# Patient Record
Sex: Male | Born: 1965 | Race: White | Hispanic: No | Marital: Single
Health system: Southern US, Community
[De-identification: ages and names within clinical notes are randomized; demographics above are authoritative.]

---

## 1998-02-02 ENCOUNTER — Emergency Department (HOSPITAL_COMMUNITY): Admission: EM | Admit: 1998-02-02 | Discharge: 1998-02-02 | Payer: Self-pay | Admitting: Emergency Medicine

## 1998-11-13 ENCOUNTER — Emergency Department (HOSPITAL_COMMUNITY): Admission: EM | Admit: 1998-11-13 | Discharge: 1998-11-13 | Payer: Self-pay | Admitting: Emergency Medicine

## 1998-11-13 ENCOUNTER — Encounter: Payer: Self-pay | Admitting: Emergency Medicine

## 2003-09-29 ENCOUNTER — Emergency Department (HOSPITAL_COMMUNITY): Admission: EM | Admit: 2003-09-29 | Discharge: 2003-09-29 | Payer: Self-pay

## 2009-02-27 ENCOUNTER — Emergency Department (HOSPITAL_COMMUNITY): Admission: EM | Admit: 2009-02-27 | Discharge: 2009-02-27 | Payer: Self-pay | Admitting: Family Medicine

## 2010-04-14 ENCOUNTER — Emergency Department (HOSPITAL_COMMUNITY)
Admission: EM | Admit: 2010-04-14 | Discharge: 2010-04-14 | Payer: Self-pay | Source: Home / Self Care | Admitting: Emergency Medicine

## 2011-01-14 ENCOUNTER — Emergency Department: Payer: Self-pay

## 2011-08-26 ENCOUNTER — Emergency Department: Payer: Self-pay | Admitting: Emergency Medicine

## 2011-08-26 LAB — CBC
HCT: 47.4 % (ref 40.0–52.0)
HGB: 16.3 g/dL (ref 13.0–18.0)
MCH: 32.1 pg (ref 26.0–34.0)
MCHC: 34.3 g/dL (ref 32.0–36.0)
MCV: 93 fL (ref 80–100)
Platelet: 257 10*3/uL (ref 150–440)
RBC: 5.07 10*6/uL (ref 4.40–5.90)
RDW: 12.3 % (ref 11.5–14.5)
WBC: 9.6 10*3/uL (ref 3.8–10.6)

## 2011-08-26 LAB — COMPREHENSIVE METABOLIC PANEL
Albumin: 4.4 g/dL (ref 3.4–5.0)
Alkaline Phosphatase: 62 U/L (ref 50–136)
Anion Gap: 7 (ref 7–16)
BUN: 6 mg/dL — ABNORMAL LOW (ref 7–18)
Bilirubin,Total: 0.7 mg/dL (ref 0.2–1.0)
Calcium, Total: 9.5 mg/dL (ref 8.5–10.1)
Chloride: 103 mmol/L (ref 98–107)
Co2: 26 mmol/L (ref 21–32)
Creatinine: 0.87 mg/dL (ref 0.60–1.30)
EGFR (African American): >60
EGFR (Non-African Amer.): >60
Glucose: 149 mg/dL — ABNORMAL HIGH (ref 65–99)
Osmolality: 272 (ref 275–301)
Potassium: 3.7 mmol/L (ref 3.5–5.1)
SGOT(AST): 18 U/L (ref 15–37)
SGPT (ALT): 32 U/L
Sodium: 136 mmol/L (ref 136–145)
Total Protein: 8.3 g/dL — ABNORMAL HIGH (ref 6.4–8.2)

## 2011-08-26 LAB — URINALYSIS, COMPLETE
Bacteria: NONE SEEN
Bilirubin,UR: NEGATIVE
Glucose,UR: NEGATIVE mg/dL (ref 0–75)
Ketone: NEGATIVE
Leukocyte Esterase: NEGATIVE
Nitrite: NEGATIVE
Ph: 7 (ref 4.5–8.0)
Protein: NEGATIVE
RBC,UR: 343 /HPF (ref 0–5)
Specific Gravity: 1.006 (ref 1.003–1.030)
Squamous Epithelial: NONE SEEN
WBC UR: 3 /HPF (ref 0–5)

## 2011-08-26 LAB — LIPASE, BLOOD: Lipase: 145 U/L (ref 73–393)

## 2011-09-11 ENCOUNTER — Emergency Department: Payer: Self-pay | Admitting: Emergency Medicine

## 2011-09-11 LAB — URINALYSIS, COMPLETE
Bacteria: NONE SEEN
Glucose,UR: NEGATIVE mg/dL (ref 0–75)
Ketone: NEGATIVE
Leukocyte Esterase: NEGATIVE
Ph: 8 (ref 4.5–8.0)
RBC,UR: 467 /HPF (ref 0–5)
Squamous Epithelial: 1
WBC UR: <1 /HPF (ref 0–5)

## 2011-09-11 LAB — BASIC METABOLIC PANEL
Anion Gap: 6 — ABNORMAL LOW (ref 7–16)
BUN: 9 mg/dL (ref 7–18)
Calcium, Total: 9.5 mg/dL (ref 8.5–10.1)
Chloride: 101 mmol/L (ref 98–107)
Co2: 29 mmol/L (ref 21–32)
Creatinine: 0.88 mg/dL (ref 0.60–1.30)
EGFR (African American): >60
EGFR (Non-African Amer.): >60
Glucose: 119 mg/dL — ABNORMAL HIGH (ref 65–99)
Osmolality: 272 (ref 275–301)
Potassium: 4.2 mmol/L (ref 3.5–5.1)
Sodium: 136 mmol/L (ref 136–145)

## 2011-09-11 LAB — CBC
HCT: 48.2 % (ref 40.0–52.0)
HGB: 15.8 g/dL (ref 13.0–18.0)
MCH: 31 pg (ref 26.0–34.0)
MCHC: 32.8 g/dL (ref 32.0–36.0)
MCV: 94 fL (ref 80–100)
Platelet: 273 10*3/uL (ref 150–440)
RBC: 5.11 10*6/uL (ref 4.40–5.90)
RDW: 12.7 % (ref 11.5–14.5)
WBC: 10.3 10*3/uL (ref 3.8–10.6)

## 2020-11-28 ENCOUNTER — Emergency Department: Payer: Self-pay

## 2020-11-28 ENCOUNTER — Other Ambulatory Visit: Payer: Self-pay

## 2020-11-28 ENCOUNTER — Emergency Department
Admission: EM | Admit: 2020-11-28 | Discharge: 2020-11-28 | Disposition: A | Discharge status: Home / Self Care | Payer: Self-pay | Attending: Emergency Medicine | Admitting: Emergency Medicine

## 2020-11-28 DIAGNOSIS — M5431 Sciatica, right side: Secondary | ICD-10-CM

## 2020-11-28 DIAGNOSIS — M5441 Lumbago with sciatica, right side: Secondary | ICD-10-CM | POA: Insufficient documentation

## 2020-11-28 DIAGNOSIS — M5442 Lumbago with sciatica, left side: Secondary | ICD-10-CM | POA: Insufficient documentation

## 2020-11-28 DIAGNOSIS — M5432 Sciatica, left side: Secondary | ICD-10-CM

## 2020-11-28 LAB — URINALYSIS, ROUTINE W REFLEX MICROSCOPIC
Bacteria, UA: NONE SEEN
Bilirubin Urine: NEGATIVE
Glucose, UA: NEGATIVE mg/dL
Ketones, ur: NEGATIVE mg/dL
Leukocytes,Ua: NEGATIVE
Nitrite: NEGATIVE
Protein, ur: NEGATIVE mg/dL
Specific Gravity, Urine: 1.009 (ref 1.005–1.030)
Squamous Epithelial / LPF: NONE SEEN (ref 0–5)
pH: 6 (ref 5.0–8.0)

## 2020-11-28 MED ORDER — LIDOCAINE 5 % EX PTCH
1.0000 | MEDICATED_PATCH | Freq: Once | CUTANEOUS | Status: DC
  Administered 2020-11-28: 1 via TRANSDERMAL
  Filled 2020-11-28: qty 1

## 2020-11-28 MED ORDER — METHOCARBAMOL 500 MG PO TABS: 500.0000 mg | ORAL_TABLET | Freq: Four times a day (QID) | ORAL | 0 refills | Status: AC | PRN

## 2020-11-28 MED ORDER — METHOCARBAMOL 500 MG PO TABS
500.0000 mg | ORAL_TABLET | Freq: Once | ORAL | Status: AC
  Administered 2020-11-28: 500 mg via ORAL
  Filled 2020-11-28: qty 1

## 2020-11-28 MED ORDER — LIDOCAINE 5 % EX PTCH: 1.0000 | MEDICATED_PATCH | Freq: Two times a day (BID) | CUTANEOUS | 1 refills | Status: AC

## 2020-11-28 MED ORDER — NAPROXEN 500 MG PO TABS
500.0000 mg | ORAL_TABLET | Freq: Once | ORAL | Status: AC
  Administered 2020-11-28: 500 mg via ORAL
  Filled 2020-11-28: qty 1

## 2020-11-28 MED ORDER — ACETAMINOPHEN 500 MG PO TABS
1000.0000 mg | ORAL_TABLET | Freq: Once | ORAL | Status: AC
  Administered 2020-11-28: 1000 mg via ORAL
  Filled 2020-11-28: qty 2

## 2020-11-28 NOTE — ED Provider Notes (Signed)
[facility] Emergency Department Provider Note ____________________________________________   Event Date/Time   First MD Initiated Contact with Patient 11/28/20 1152     (approximate)  I have reviewed the triage vital signs and the nursing notes.  HISTORY  Chief Complaint Back Pain   HPI Jason Simon is a 55 y.o. malewho presents to the ED for evaluation of left back pain.   Chart review indicates no relevant hx.   Patient presents to the ED for evaluation of atraumatic bilateral back pain of the past 2-3 days.  He reports shooting pains, down his right-sided buttocks, that are sharp and "feel like lightning."  He reports waking up with the symptoms and denies any trauma or falls.  Denies fevers, saddle anesthesias, stool or urinary retention.   Patient presents with his mother.  As documented below, she does pull one of our technicians aside and confides in her that the patient did fall a couple days ago, but for some reason just really does not want to tell us.  No past medical history on file.  There are no problems to display for this patient.     Prior to Admission medications   Medication Sig Start Date End Date Taking? Authorizing Provider  lidocaine (LIDODERM) 5 % Place 1 patch onto the skin every 12 (twelve) hours. Remove & Discard patch within 12 hours or as directed by MD 11/28/20 11/28/21 Yes Delton Prairie, MD  methocarbamol (ROBAXIN) 500 MG tablet Take 1 tablet (500 mg total) by mouth every 6 (six) hours as needed for muscle spasms. 11/28/20  Yes Delton Prairie, MD    Allergies Patient has no known allergies.  No family history on file.  Social History    Review of Systems  Constitutional: No fever/chills Eyes: No visual changes. ENT: No sore throat. Cardiovascular: Denies chest pain. Respiratory: Denies shortness of breath. Gastrointestinal: No abdominal pain.  No nausea, no vomiting.  No diarrhea.  No  constipation. Genitourinary: Negative for dysuria. Musculoskeletal: Positive for back pain with sciatica Skin: Negative for rash. Neurological: Negative for headaches, focal weakness or numbness.   ____________________________________________   PHYSICAL EXAM:  VITAL SIGNS: Vitals:   11/28/20 1125 11/28/20 1126  BP:  (!) 167/90  Pulse: 100   Resp: 18   Temp: 98.7 F (37.1 C)   SpO2: 96%      Constitutional: Alert and oriented. Well appearing and in no acute distress.  Able to swing his legs over and stand up from the stretcher.  States hunched over, and ambulates with a shuffling gait Eyes: Conjunctivae are normal. PERRL. EOMI. Head: Atraumatic. Nose: No congestion/rhinnorhea. Mouth/Throat: Mucous membranes are moist.  Oropharynx non-erythematous. Neck: No stridor. No cervical spine tenderness to palpation. Cardiovascular: Normal rate, regular rhythm. Grossly normal heart sounds.  Good peripheral circulation. Respiratory: Normal respiratory effort.  No retractions. Lungs CTAB. Gastrointestinal: Soft , nondistended, nontender to palpation. No CVA tenderness. Musculoskeletal: No lower extremity tenderness nor edema.  No joint effusions. No signs of acute trauma. No spinal tenderness, signs of trauma to the back or spinal step-offs.  Tenderness to palpation to left-sided paraspinal lumbar and sacral musculature. On reassessment, I again assess his lower thoracic and upper L-spine, without point bony tenderness to these areas. Neurologic:  Normal speech and language. No gross focal neurologic deficits are appreciated. No gait instability noted. Skin:  Skin is warm, dry and intact. No rash noted. Psychiatric: Mood and affect are normal. Speech and behavior are normal.  ____________________________________________  LABS (all labs ordered are listed, but only abnormal results are displayed)  Labs Reviewed  URINALYSIS, ROUTINE W REFLEX MICROSCOPIC - Abnormal; Notable for the  following components:      Result Value   Color, Urine YELLOW (*)    APPearance CLEAR (*)    Hgb urine dipstick MODERATE (*)    All other components within normal limits   ____________________________________________  12 Lead EKG   ____________________________________________  RADIOLOGY  ED MD interpretation:    Official radiology report(s): DG Lumbar Spine Complete  Result Date: 11/28/2020 CLINICAL DATA:  Fall, right leg pain EXAM: LUMBAR SPINE - COMPLETE 4+ VIEW COMPARISON:  None. FINDINGS: Degenerative facet disease in the lower lumbar spine. Normal alignment. Mild wedge deformity of the T12 vertebral body. Cannot exclude mild compression fracture. Transitional anatomy at the lumbosacral junction. IMPRESSION: Mild wedged appearance of the T12 vertebral body, question mild compression fracture. Transitional anatomy at the lumbosacral junction. Degenerative facet disease in the lower lumbar spine. Electronically Signed   By: Charlett Nose M.D.   On: 11/28/2020 12:40    ____________________________________________   PROCEDURES and INTERVENTIONS  Procedure(s) performed (including Critical Care):  Procedures  Medications  lidocaine (LIDODERM) 5 % 1 patch (1 patch Transdermal Patch Applied 11/28/20 1208)  acetaminophen (TYLENOL) tablet 1,000 mg (1,000 mg Oral Given 11/28/20 1207)  naproxen (NAPROSYN) tablet 500 mg (500 mg Oral Given 11/28/20 1208)  methocarbamol (ROBAXIN) tablet 500 mg (500 mg Oral Given 11/28/20 1208)    ____________________________________________   MDM / ED COURSE   55 year old male presents to the ED with bilateral sciatica symptoms, without red flag features, and amenable to outpatient management.  No evidence of cauda equina syndrome.  He denies falls or trauma, his mother pulses aside and indicates otherwise.  Plain films were therefore obtained and questions a T12 mild compression wedge fracture.  He has no tenderness to this area and his pain is much  lower than this, so I do not suspect an acute compression fracture.  He is ambulatory, with improving symptoms with nonnarcotic multimodal analgesia.  Will discharge with the same and with return precautions for the ED.   Clinical Course as of 11/28/20 1330  Tue Nov 28, 2020  1209 One of our EMT techs approaches me, tells me that patient's mother approached her and told her that the patient did fall a couple days ago prior to his pain starting.  She reports that he does not want to tell any of [place], for unknown reasons. But she does know that he fell. Thinks he needs an XR [DS]  1314 Reassessed. Pt reports feeling. Reassessed his back [DS]    Clinical Course User Index [DS] Delton Prairie, MD    ____________________________________________   FINAL CLINICAL IMPRESSION(S) / ED DIAGNOSES  Final diagnoses:  Bilateral sciatica     ED Discharge Orders          Ordered    lidocaine (LIDODERM) 5 %  Every 12 hours        11/28/20 1159    methocarbamol (ROBAXIN) 500 MG tablet  Every 6 hours PRN        11/28/20 1159             Azarria Balint   Note:  This document was prepared using Conservation officer, historic buildings and may include unintentional dictation errors.    Delton Prairie, MD 11/28/20 1332

## 2020-11-28 NOTE — Discharge Instructions (Addendum)
Use Tylenol for pain and fevers.  Up to 1000 mg per dose, up to 4 times per day.  Do not take more than 4000 mg of Tylenol/acetaminophen within 24 hours..  Use naproxen/Aleve for anti-inflammatory pain relief. Use up to 500mg  every 12 hours. Do not take more frequently than this. Do not use other NSAIDs (ibuprofen, Advil) while taking this medication. It is safe to take Tylenol with this.   Please use lidocaine patches and your site of pain.  Apply 1 patch at a time, leave on for 12 hours, then remove for 12 hours.  12 hours on, 12 hours off.  Do not apply more than 1 patch at a time.

## 2020-11-28 NOTE — ED Triage Notes (Signed)
Pt reports that his right leg pain from lower that is shooting for a while and now his states that his left leg is hurting and he can barley walk or sleep.

## 2023-02-23 IMAGING — CR DG LUMBAR SPINE COMPLETE 4+V
5 series · 5 of 5 positions shown · non-contrast
Comparison: None.

CLINICAL DATA: Fall, right leg pain

EXAM:
LUMBAR SPINE - COMPLETE 4+ VIEW

[l-spine ap]
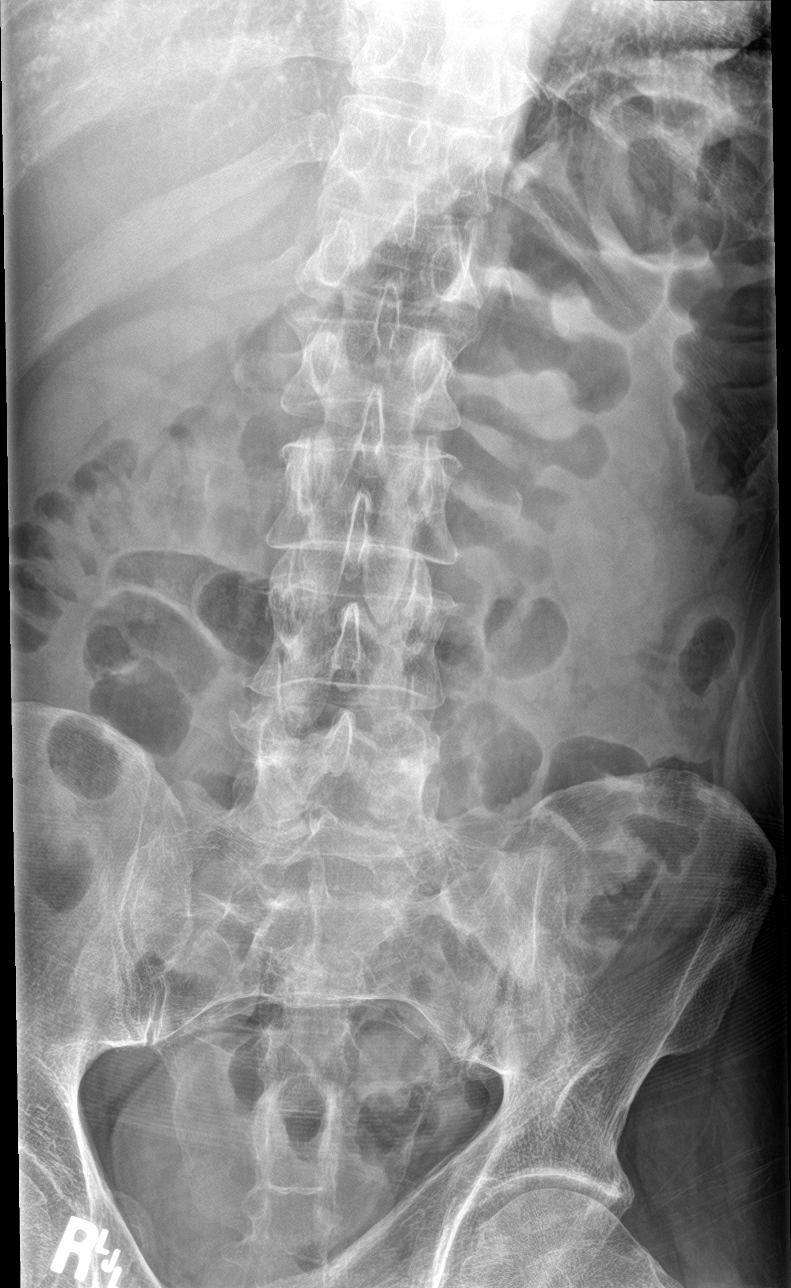

[l-spine obl (1 of 2)]
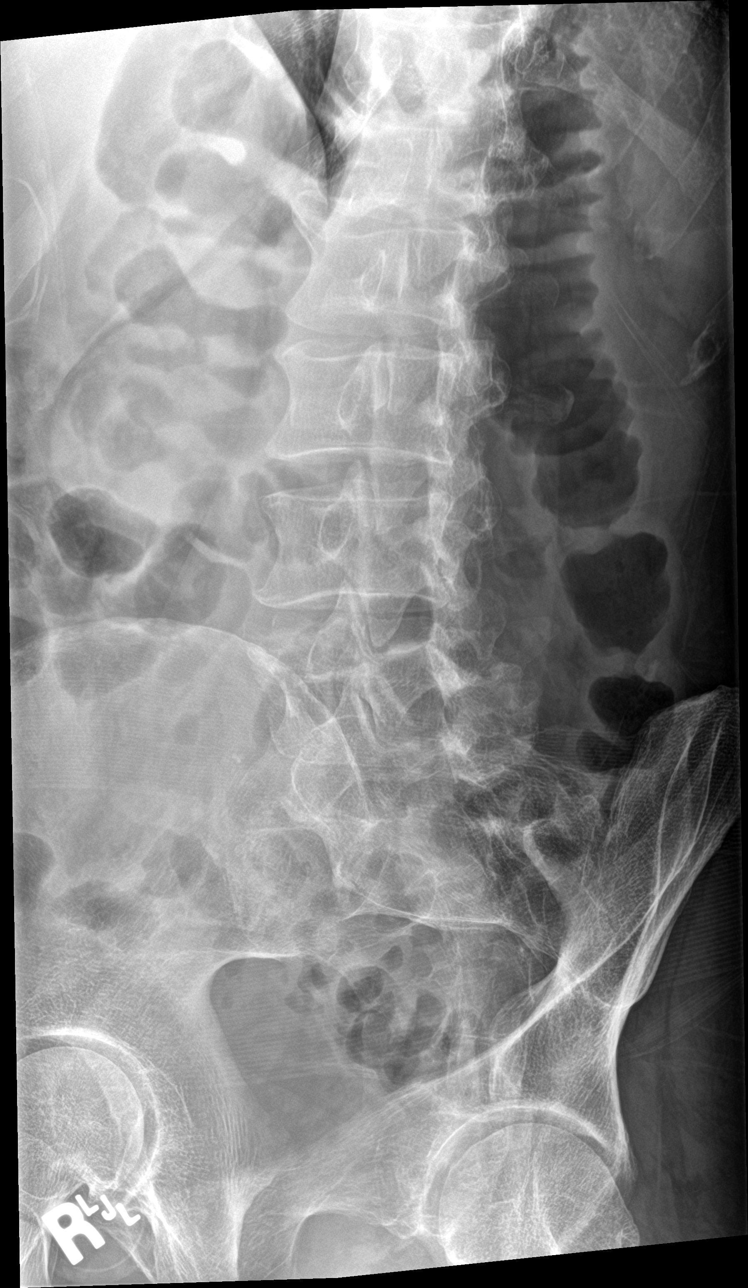

[l-spine obl (2 of 2)]
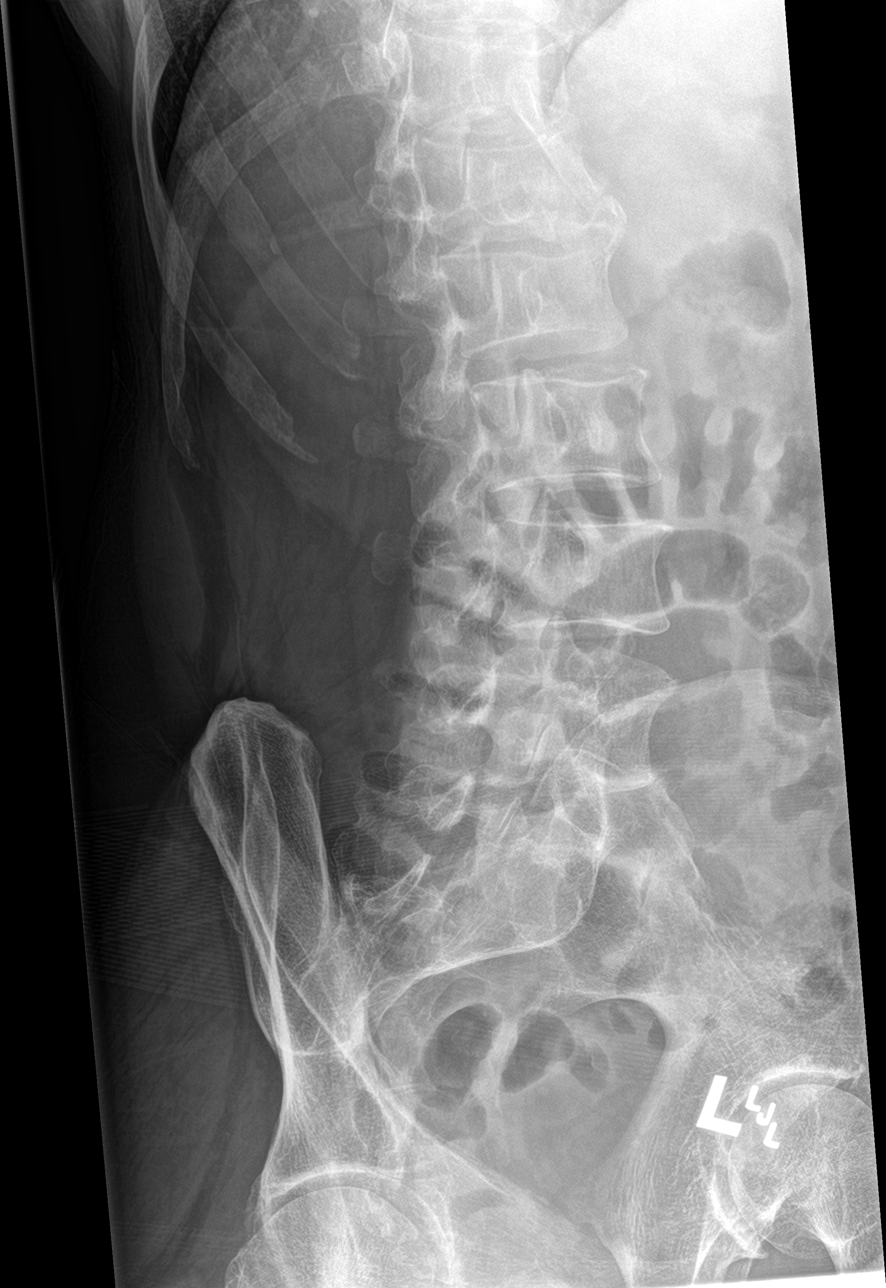

[l-spine lat]
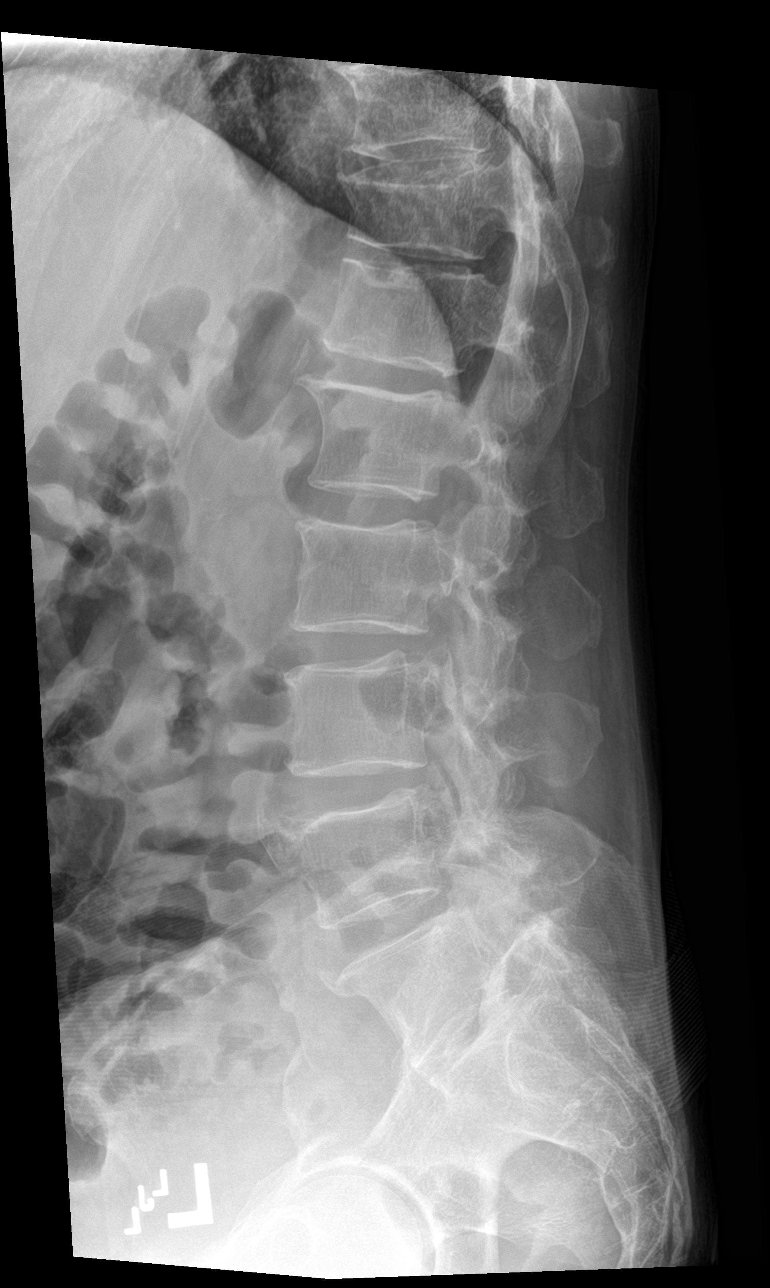

[l-spine spot]
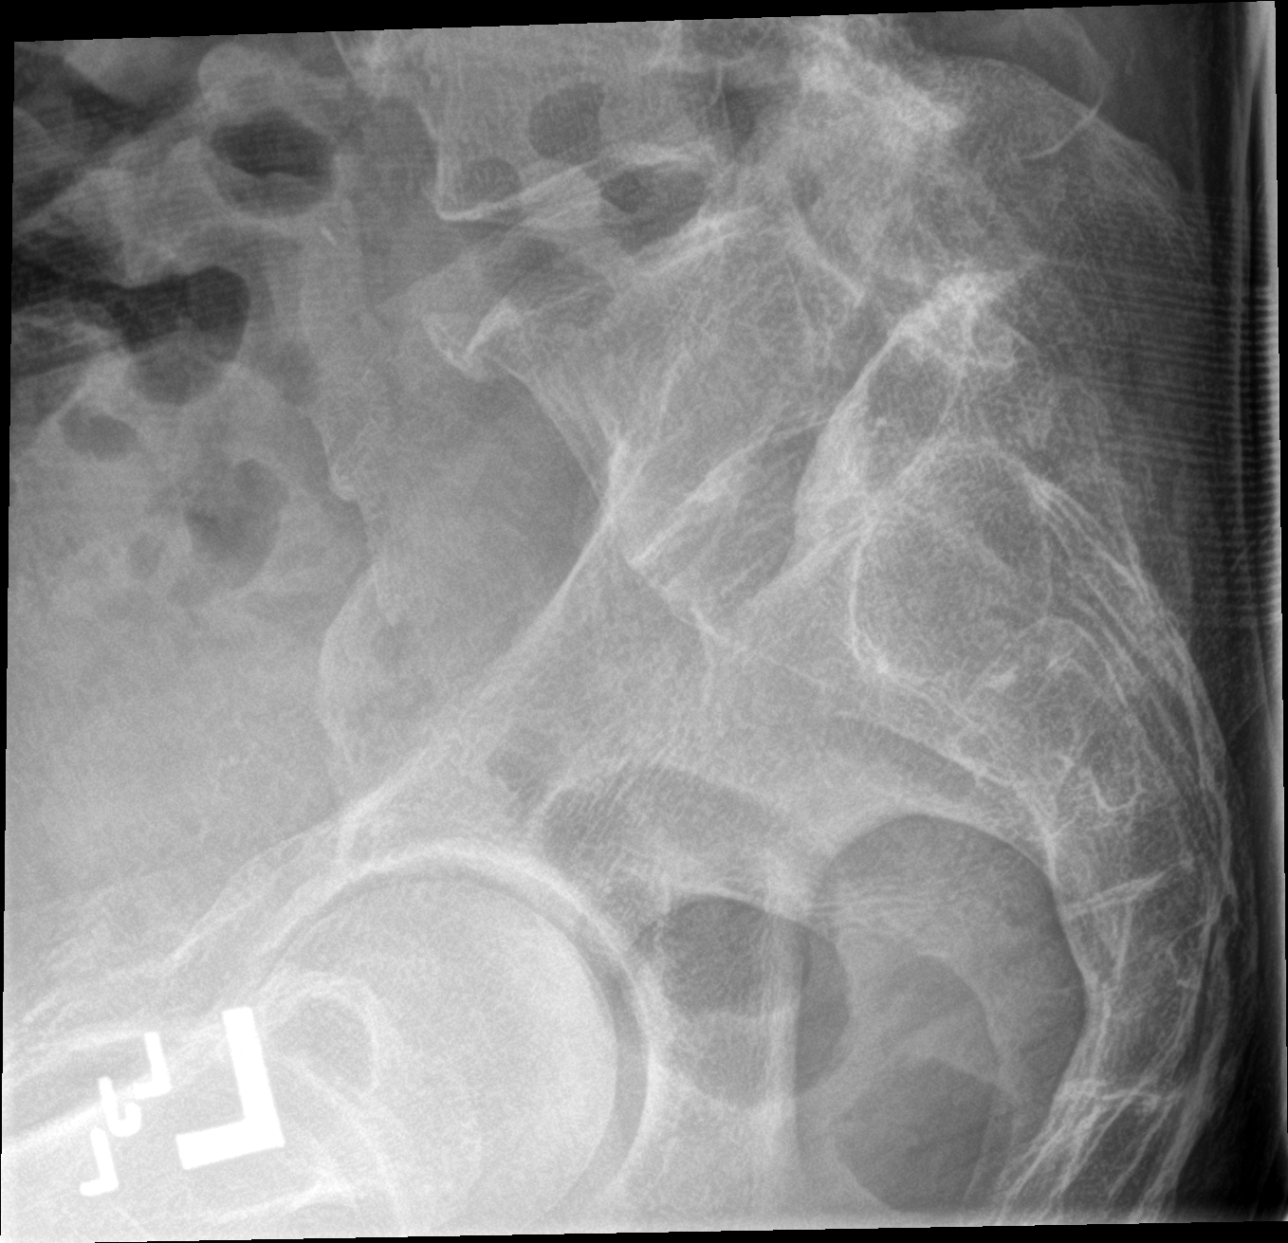

[5 of 5 positions shown; findings below may reference images not displayed]

FINDINGS: Degenerative facet disease in the lower lumbar spine. Normal
alignment. Mild wedge deformity of the T12 vertebral body. Cannot
exclude mild compression fracture. Transitional anatomy at the
lumbosacral junction.
IMPRESSION: Mild wedged appearance of the T12 vertebral body, question mild
compression fracture.

Transitional anatomy at the lumbosacral junction.

Degenerative facet disease in the lower lumbar spine.
